# Patient Record
Sex: Female | Born: 2005
Health system: Southern US, Community
[De-identification: ages and names within clinical notes are randomized; demographics above are authoritative.]

## PROBLEM LIST (undated history)

## (undated) DIAGNOSIS — Z889 Allergy status to unspecified drugs, medicaments and biological substances status: Secondary | ICD-10-CM

## (undated) HISTORY — PX: NO PAST SURGERIES: SHX2092

---
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
## 2023-02-18 ENCOUNTER — Encounter (INDEPENDENT_AMBULATORY_CARE_PROVIDER_SITE_OTHER): Payer: Self-pay

## 2023-10-26 ENCOUNTER — Telehealth (INDEPENDENT_AMBULATORY_CARE_PROVIDER_SITE_OTHER): Payer: Self-pay | Admitting: Pediatric Endocrinology

## 2023-10-26 NOTE — Telephone Encounter (Signed)
 Called left HIPAA approved vm

## 2023-10-26 NOTE — Telephone Encounter (Signed)
 Who's calling (name and relationship to patient) : [place]; mom   Best contact number: [phone]  Provider they see: Dr. Vanessa [place]  Reason for call: Mom called in wanting to know if Kanyla would be able to get a month supply until she is able to get into an adult Endo. Mom is requesting a call back to confirm.    Call ID:      PRESCRIPTION REFILL ONLY  Name of prescription:  Pharmacy:

## 2023-10-27 NOTE — Telephone Encounter (Signed)
 Called left HIPAA approved vm

## 2023-10-28 ENCOUNTER — Encounter (INDEPENDENT_AMBULATORY_CARE_PROVIDER_SITE_OTHER): Payer: Self-pay

## 2023-10-28 NOTE — Telephone Encounter (Signed)
 Mailed letter

## 2024-03-02 ENCOUNTER — Other Ambulatory Visit: Payer: Self-pay | Admitting: Endocrinology

## 2024-03-02 DIAGNOSIS — E049 Nontoxic goiter, unspecified: Secondary | ICD-10-CM

## 2024-03-05 ENCOUNTER — Inpatient Hospital Stay
Admission: RE | Admit: 2024-03-05 | Discharge: 2024-03-05 | Discharge status: Home / Self Care | Source: Ambulatory Visit | Attending: Endocrinology | Admitting: Endocrinology

## 2024-03-05 DIAGNOSIS — E049 Nontoxic goiter, unspecified: Secondary | ICD-10-CM
